# Patient Record
Sex: Female | Born: 1970 | Race: White | Hispanic: No | Smoking: Never smoker
Health system: Southern US, Community
[De-identification: ages and names within clinical notes are randomized; demographics above are authoritative.]

## PROBLEM LIST (undated history)

## (undated) DIAGNOSIS — F329 Major depressive disorder, single episode, unspecified: Secondary | ICD-10-CM

## (undated) DIAGNOSIS — F419 Anxiety disorder, unspecified: Secondary | ICD-10-CM

## (undated) DIAGNOSIS — J45909 Unspecified asthma, uncomplicated: Secondary | ICD-10-CM

## (undated) DIAGNOSIS — F32A Depression, unspecified: Secondary | ICD-10-CM

## (undated) DIAGNOSIS — I38 Endocarditis, valve unspecified: Secondary | ICD-10-CM

## (undated) DIAGNOSIS — M1990 Unspecified osteoarthritis, unspecified site: Secondary | ICD-10-CM

## (undated) HISTORY — PX: TUBAL LIGATION: SHX77

---

## 2015-02-22 ENCOUNTER — Other Ambulatory Visit: Payer: Self-pay | Admitting: Unknown Physician Specialty

## 2015-02-22 DIAGNOSIS — G8929 Other chronic pain: Secondary | ICD-10-CM

## 2015-02-22 DIAGNOSIS — M25562 Pain in left knee: Principal | ICD-10-CM

## 2015-03-15 ENCOUNTER — Ambulatory Visit
Admission: RE | Admit: 2015-03-15 | Discharge: 2015-03-15 | Disposition: A | Discharge status: Home / Self Care | Payer: 59 | Source: Ambulatory Visit | Attending: Unknown Physician Specialty | Admitting: Unknown Physician Specialty

## 2015-03-15 DIAGNOSIS — M949 Disorder of cartilage, unspecified: Secondary | ICD-10-CM | POA: Diagnosis not present

## 2015-03-15 DIAGNOSIS — G8929 Other chronic pain: Secondary | ICD-10-CM | POA: Diagnosis not present

## 2015-03-15 DIAGNOSIS — M25562 Pain in left knee: Secondary | ICD-10-CM | POA: Insufficient documentation

## 2015-03-15 DIAGNOSIS — M2392 Unspecified internal derangement of left knee: Secondary | ICD-10-CM | POA: Diagnosis not present

## 2015-06-24 ENCOUNTER — Encounter: Payer: Self-pay | Admitting: *Deleted

## 2015-06-29 ENCOUNTER — Encounter
Admission: RE | Disposition: A | Discharge status: Home / Self Care | Payer: Self-pay | Source: Ambulatory Visit | Attending: Surgery

## 2015-06-29 ENCOUNTER — Ambulatory Visit
Admission: RE | Admit: 2015-06-29 | Discharge: 2015-06-29 | Disposition: A | Discharge status: Home / Self Care | Payer: Worker's Compensation | Source: Ambulatory Visit | Attending: Surgery | Admitting: Surgery

## 2015-06-29 ENCOUNTER — Ambulatory Visit: Payer: Worker's Compensation | Admitting: Student in an Organized Health Care Education/Training Program

## 2015-06-29 DIAGNOSIS — M222X2 Patellofemoral disorders, left knee: Secondary | ICD-10-CM | POA: Diagnosis present

## 2015-06-29 DIAGNOSIS — F419 Anxiety disorder, unspecified: Secondary | ICD-10-CM | POA: Diagnosis not present

## 2015-06-29 DIAGNOSIS — J45909 Unspecified asthma, uncomplicated: Secondary | ICD-10-CM | POA: Diagnosis not present

## 2015-06-29 DIAGNOSIS — Z885 Allergy status to narcotic agent status: Secondary | ICD-10-CM | POA: Diagnosis not present

## 2015-06-29 DIAGNOSIS — Z880 Allergy status to penicillin: Secondary | ICD-10-CM | POA: Insufficient documentation

## 2015-06-29 DIAGNOSIS — Z882 Allergy status to sulfonamides status: Secondary | ICD-10-CM | POA: Insufficient documentation

## 2015-06-29 DIAGNOSIS — Z79899 Other long term (current) drug therapy: Secondary | ICD-10-CM | POA: Diagnosis not present

## 2015-06-29 DIAGNOSIS — F329 Major depressive disorder, single episode, unspecified: Secondary | ICD-10-CM | POA: Insufficient documentation

## 2015-06-29 DIAGNOSIS — Z791 Long term (current) use of non-steroidal anti-inflammatories (NSAID): Secondary | ICD-10-CM | POA: Insufficient documentation

## 2015-06-29 DIAGNOSIS — M6752 Plica syndrome, left knee: Secondary | ICD-10-CM | POA: Diagnosis not present

## 2015-06-29 DIAGNOSIS — M2242 Chondromalacia patellae, left knee: Secondary | ICD-10-CM | POA: Insufficient documentation

## 2015-06-29 HISTORY — DX: Unspecified asthma, uncomplicated: J45.909

## 2015-06-29 HISTORY — DX: Major depressive disorder, single episode, unspecified: F32.9

## 2015-06-29 HISTORY — DX: Depression, unspecified: F32.A

## 2015-06-29 HISTORY — DX: Unspecified osteoarthritis, unspecified site: M19.90

## 2015-06-29 HISTORY — PX: KNEE ARTHROSCOPY: SHX127

## 2015-06-29 HISTORY — DX: Endocarditis, valve unspecified: I38

## 2015-06-29 HISTORY — DX: Anxiety disorder, unspecified: F41.9

## 2015-06-29 SURGERY — ARTHROSCOPY, KNEE
Anesthesia: General | Laterality: Left | Wound class: Clean

## 2015-06-29 MED ORDER — HYDROCODONE-ACETAMINOPHEN 5-325 MG PO TABS: 1.0000 | ORAL_TABLET | ORAL | Status: DC | PRN

## 2015-06-29 MED ORDER — MEPERIDINE HCL 25 MG/ML IJ SOLN: 6.2500 mg | INTRAMUSCULAR | Status: DC | PRN

## 2015-06-29 MED ORDER — PROMETHAZINE HCL 25 MG/ML IJ SOLN: 6.2500 mg | INTRAMUSCULAR | Status: DC | PRN

## 2015-06-29 MED ORDER — MIDAZOLAM HCL 5 MG/5ML IJ SOLN
INTRAMUSCULAR | Status: DC | PRN
  Administered 2015-06-29: 2 mg via INTRAVENOUS

## 2015-06-29 MED ORDER — LACTATED RINGERS IV SOLN
INTRAVENOUS | Status: DC
  Administered 2015-06-29: 13:00:00 via INTRAVENOUS

## 2015-06-29 MED ORDER — HYDROMORPHONE HCL 1 MG/ML IJ SOLN: 0.2500 mg | INTRAMUSCULAR | Status: DC | PRN

## 2015-06-29 MED ORDER — HYDROCODONE-ACETAMINOPHEN 5-325 MG PO TABS: 1.0000 | ORAL_TABLET | Freq: Four times a day (QID) | ORAL | Status: DC | PRN

## 2015-06-29 MED ORDER — OXYCODONE HCL 5 MG PO TABS
5.0000 mg | ORAL_TABLET | Freq: Once | ORAL | Status: AC | PRN
  Administered 2015-06-29: 5 mg via ORAL

## 2015-06-29 MED ORDER — GLYCOPYRROLATE 0.2 MG/ML IJ SOLN
INTRAMUSCULAR | Status: DC | PRN
  Administered 2015-06-29: 0.1 mg via INTRAVENOUS

## 2015-06-29 MED ORDER — DEXAMETHASONE SODIUM PHOSPHATE 4 MG/ML IJ SOLN
INTRAMUSCULAR | Status: DC | PRN
  Administered 2015-06-29: 4 mg via INTRAVENOUS

## 2015-06-29 MED ORDER — LIDOCAINE HCL (CARDIAC) 20 MG/ML IV SOLN
INTRAVENOUS | Status: DC | PRN
  Administered 2015-06-29: 40 mg via INTRATRACHEAL

## 2015-06-29 MED ORDER — METOCLOPRAMIDE HCL 5 MG PO TABS: 5.0000 mg | ORAL_TABLET | Freq: Three times a day (TID) | ORAL | Status: DC | PRN

## 2015-06-29 MED ORDER — OXYCODONE HCL 5 MG/5ML PO SOLN: 5.0000 mg | Freq: Once | ORAL | Status: AC | PRN

## 2015-06-29 MED ORDER — ONDANSETRON HCL 4 MG/2ML IJ SOLN
INTRAMUSCULAR | Status: DC | PRN
  Administered 2015-06-29: 4 mg via INTRAVENOUS

## 2015-06-29 MED ORDER — CLINDAMYCIN PHOSPHATE 900 MG/50ML IV SOLN
900.0000 mg | Freq: Once | INTRAVENOUS | Status: AC
  Administered 2015-06-29: 900 mg via INTRAVENOUS

## 2015-06-29 MED ORDER — ONDANSETRON HCL 4 MG PO TABS: 4.0000 mg | ORAL_TABLET | Freq: Four times a day (QID) | ORAL | Status: DC | PRN

## 2015-06-29 MED ORDER — BUPIVACAINE-EPINEPHRINE (PF) 0.5% -1:200000 IJ SOLN
INTRAMUSCULAR | Status: DC | PRN
  Administered 2015-06-29: 30 mL

## 2015-06-29 MED ORDER — PROPOFOL 10 MG/ML IV BOLUS
INTRAVENOUS | Status: DC | PRN
  Administered 2015-06-29: 150 mg via INTRAVENOUS

## 2015-06-29 MED ORDER — METOCLOPRAMIDE HCL 5 MG/ML IJ SOLN: 5.0000 mg | Freq: Three times a day (TID) | INTRAMUSCULAR | Status: DC | PRN

## 2015-06-29 MED ORDER — ONDANSETRON HCL 4 MG/2ML IJ SOLN: 4.0000 mg | Freq: Four times a day (QID) | INTRAMUSCULAR | Status: DC | PRN

## 2015-06-29 MED ORDER — FENTANYL CITRATE (PF) 100 MCG/2ML IJ SOLN
INTRAMUSCULAR | Status: DC | PRN
  Administered 2015-06-29: 100 ug via INTRAVENOUS

## 2015-06-29 MED ORDER — POTASSIUM CHLORIDE IN NACL 20-0.9 MEQ/L-% IV SOLN: INTRAVENOUS | Status: DC

## 2015-06-29 MED ORDER — LIDOCAINE HCL 1 % IJ SOLN
INTRAMUSCULAR | Status: DC | PRN
  Administered 2015-06-29: 60 mL via INTRAMUSCULAR

## 2015-06-29 SURGICAL SUPPLY — 30 items
BANDAGE ELASTIC 6 VELCRO NS (GAUZE/BANDAGES/DRESSINGS) ×3 IMPLANT
BLADE FULL RADIUS 3.5 (BLADE) ×3 IMPLANT
BUR ACROMIONIZER 4.0 (BURR) IMPLANT
CHLORAPREP W/TINT 26ML (MISCELLANEOUS) ×3 IMPLANT
COVER LIGHT HANDLE UNIVERSAL (MISCELLANEOUS) ×6 IMPLANT
CUFF TOURN SGL QUICK 30 (MISCELLANEOUS) ×2
CUFF TOURN SGL QUICK 34 (TOURNIQUET CUFF)
CUFF TRNQT CYL 34X4X40X1 (TOURNIQUET CUFF) IMPLANT
CUFF TRNQT CYL LO 30X4X (MISCELLANEOUS) ×1 IMPLANT
DRAPE IMP U-DRAPE 54X76 (DRAPES) ×3 IMPLANT
GAUZE SPONGE 4X4 12PLY STRL (GAUZE/BANDAGES/DRESSINGS) ×3 IMPLANT
GLOVE BIO SURGEON STRL SZ8 (GLOVE) ×6 IMPLANT
GLOVE INDICATOR 8.0 STRL GRN (GLOVE) ×3 IMPLANT
GOWN STRL REUS W/ TWL LRG LVL3 (GOWN DISPOSABLE) ×1 IMPLANT
GOWN STRL REUS W/ TWL XL LVL3 (GOWN DISPOSABLE) ×1 IMPLANT
GOWN STRL REUS W/TWL LRG LVL3 (GOWN DISPOSABLE) ×2
GOWN STRL REUS W/TWL XL LVL3 (GOWN DISPOSABLE) ×2
IV LACTATED RINGER IRRG 3000ML (IV SOLUTION) ×2
IV LR IRRIG 3000ML ARTHROMATIC (IV SOLUTION) ×1 IMPLANT
KIT ROOM TURNOVER OR (KITS) ×3 IMPLANT
MANIFOLD 4PT FOR NEPTUNE1 (MISCELLANEOUS) ×3 IMPLANT
NEEDLE HYPO 21X1.5 SAFETY (NEEDLE) ×6 IMPLANT
PACK ARTHROSCOPY KNEE (MISCELLANEOUS) ×3 IMPLANT
STRAP BODY AND KNEE 60X3 (MISCELLANEOUS) ×3 IMPLANT
SUT PROLENE 4 0 PS 2 18 (SUTURE) ×3 IMPLANT
SUT VIC AB 2-0 CT1 27 (SUTURE)
SUT VIC AB 2-0 CT1 TAPERPNT 27 (SUTURE) IMPLANT
SYR 50ML LL SCALE MARK (SYRINGE) ×3 IMPLANT
TUBING ARTHRO INFLOW-ONLY STRL (TUBING) ×3 IMPLANT
WAND HAND CNTRL MULTIVAC 90 (MISCELLANEOUS) ×3 IMPLANT

## 2015-06-29 NOTE — Anesthesia Preprocedure Evaluation (Addendum)
Anesthesia Evaluation  Patient identified by MRN, date of birth, ID band Patient awake    Reviewed: Allergy & Precautions, NPO status , Patient's Chart, lab work & pertinent test results, reviewed documented beta blocker date and time   Airway Mallampati: II  TM Distance: >3 FB Neck ROM: Full    Dental no notable dental hx.    Pulmonary asthma ,    Pulmonary exam normal        Cardiovascular Normal cardiovascular exam+ Valvular Problems/Murmurs      Neuro/Psych negative neurological ROS     GI/Hepatic negative GI ROS, Neg liver ROS,   Endo/Other  negative endocrine ROS  Renal/GU negative Renal ROS  negative genitourinary   Musculoskeletal  (+) Arthritis , Osteoarthritis,    Abdominal   Peds  Hematology   Anesthesia Other Findings   Reproductive/Obstetrics                             Anesthesia Physical Anesthesia Plan  ASA: II  Anesthesia Plan: General   Post-op Pain Management:    Induction: Intravenous  Airway Management Planned: LMA  Additional Equipment:   Intra-op Plan:   Post-operative Plan:   Informed Consent: I have reviewed the patients History and Physical, chart, labs and discussed the procedure including the risks, benefits and alternatives for the proposed anesthesia with the patient or authorized representative who has indicated his/her understanding and acceptance.     Plan Discussed with: CRNA  Anesthesia Plan Comments:         Anesthesia Quick Evaluation

## 2015-06-29 NOTE — Transfer of Care (Signed)
Immediate Anesthesia Transfer of Care Note  Patient: Sara Velasquez  Procedure(s) Performed: Procedure(s): ARTHROSCOPY KNEE WITH DEBRIDEMENT ABRASION CHONDNOPLASTY OF THE PATELLA AND POSSIBLE LATERAL RELEASE (Left)  Patient Location: PACU  Anesthesia Type: General  Level of Consciousness: awake, alert  and patient cooperative  Airway and Oxygen Therapy: Patient Spontanous Breathing and Patient connected to supplemental oxygen  Post-op Assessment: Post-op Vital signs reviewed, Patient's Cardiovascular Status Stable, Respiratory Function Stable, Patent Airway and No signs of Nausea or vomiting  Post-op Vital Signs: Reviewed and stable  Complications: No apparent anesthesia complications

## 2015-06-29 NOTE — Anesthesia Procedure Notes (Signed)
Procedure Name: LMA Insertion Date/Time: 06/29/2015 3:25 PM Performed by: Jimmy PicketAMYOT, Diar Berkel Pre-anesthesia Checklist: Patient identified, Emergency Drugs available, Suction available, Timeout performed and Patient being monitored Patient Re-evaluated:Patient Re-evaluated prior to inductionOxygen Delivery Method: Circle system utilized Preoxygenation: Pre-oxygenation with 100% oxygen Intubation Type: IV induction LMA: LMA inserted LMA Size: 4.0 Number of attempts: 1 Placement Confirmation: positive ETCO2 and breath sounds checked- equal and bilateral Tube secured with: Tape

## 2015-06-29 NOTE — H&P (Signed)
Paper H&P to be scanned into permanent record. H&P reviewed. No changes. 

## 2015-06-29 NOTE — Op Note (Signed)
06/29/2015  4:18 PM  Patient:   Sara Velasquez  Pre-Op Diagnosis:   Patellofemoral syndrome, left knee.  Postoperative diagnosis:   Patellofemoral syndrome with medial plica, left knee.  Procedure:   Arthroscopic excision of medial plica with abrasion chondroplasty of patella, left knee.  Surgeon:   Maryagnes AmosJ. Jeffrey Seleena Reimers, M.D.  Anesthesia:   General LMA.  Findings:   As above. The medial and lateral menisci both were in excellent condition, as were the anterior and posterior cruciate ligaments. 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 The remaining articular surfaces all were in excellent condition. The patella appeared to track well and showed no excessive tightness laterally.  Complications:   None.  EBL:   10 cc.  Total fluids:   700 cc of crystalloid.  Tourniquet time:   None  Drains:   None  Closure:   4-0 Prolene interrupted sutures.  Brief clinical note:   The patient is a 45 year old female with a history of anterior knee pain. Her symptoms have persisted despite medications, activity modification, etc. Her history and examination are consistent with patellofemoral syndrome with a possible symptomatic plica. An MRI scan demonstrated an area of chondromalacia on the central ridge of the patella, but no evidence for meniscal or ligamentous pathology. The patient presents at this time for arthroscopy, debridement, and possible lateral release.  Procedure:   The patient was brought into the operating room and lain in the supine position. After adequate general laryngal mask anesthesia was obtained, a timeout was performed to verify the appropriate side. The patient's left knee was injected sterilely using a solution of 30 cc of 1% lidocaine and 30 cc of 0.5% Sensorcaine with epinephrine.  The left lower extremity was prepped with ChloraPrep solution before being draped sterilely. Preoperative antibiotics were administered. The expected portal sites were injected with 0.5% Sensorcaine with epinephrine before the camera was placed in the anterolateral portal and instrumentation performed through the anteromedial portal. The knee was sequentially examined beginning in the suprapatellar pouch, then progressing to the patellofemoral space, the medial gutter compartment, the notch, and finally the lateral compartment and gutter. The findings were as described above. Abundant reactive synovial tissues anteriorly were debrided using the full-radius resector in order to improve visualization. While performing this, a medial shelf plica was identified and debrided as well. The medial and lateral menisci were carefully probed with the findings as described above. The area of chondromalacia was debrided back to stable margins using the full-radius resector. The ArthroCare wand was used to obtain hemostasis before the instruments were removed from the joint after suctioning the excess fluid. It was elected not to perform a lateral release as the patella appeared to be tracking well and there was no evidence of any excessive tightness laterally. The portal sites were closed using 4-0 Prolene interrupted sutures before a sterile bulky dressing was applied to the knee. The patient was then awakened, extubated, and returned to the recovery room in satisfactory condition after tolerating the procedure well.

## 2015-06-29 NOTE — Discharge Instructions (Signed)
General Anesthesia, Adult, Care After °Refer to this sheet in the next few weeks. These instructions provide you with information on caring for yourself after your procedure. Your health care provider may also give you more specific instructions. Your treatment has been planned according to current medical practices, but problems sometimes occur. Call your health care provider if you have any problems or questions after your procedure. °WHAT TO EXPECT AFTER THE PROCEDURE °After the procedure, it is typical to experience: °· Sleepiness. °· Nausea and vomiting. °HOME CARE INSTRUCTIONS °· For the first 24 hours after general anesthesia: °¨ Have a responsible person with you. °¨ Do not drive a car. If you are alone, do not take public transportation. °¨ Do not drink alcohol. °¨ Do not take medicine that has not been prescribed by your health care provider. °¨ Do not sign important papers or make important decisions. °¨ You may resume a normal diet and activities as directed by your health care provider. °· Change bandages (dressings) as directed. °· If you have questions or problems that seem related to general anesthesia, call the hospital and ask for the anesthetist or anesthesiologist on call. °SEEK MEDICAL CARE IF: °· You have nausea and vomiting that continue the day after anesthesia. °· You develop a rash. °SEEK IMMEDIATE MEDICAL CARE IF:  °· You have difficulty breathing. °· You have chest pain. °· You have any allergic problems. °  °This information is not intended to replace advice given to you by your health care provider. Make sure you discuss any questions you have with your health care provider. °  °Document Released: 06/25/2000 Document Revised: 04/09/2014 Document Reviewed: 07/18/2011 °Elsevier Interactive Patient Education ©2016 [employer] ° °Keep dressing dry and intact.  °May shower after dressing changed on post-op day #4 (Sunday).  °Cover staples/sutures with Band-Aids after drying off. °Apply ice  frequently to knee. °May weight-bear as tolerated - use crutches or walker as needed. °Follow-up in 10-14 days or as scheduled. °

## 2015-06-30 ENCOUNTER — Encounter: Payer: Self-pay | Admitting: Surgery

## 2015-06-30 NOTE — Anesthesia Postprocedure Evaluation (Signed)
Anesthesia Post Note  Patient: Sara Velasquez  Procedure(s) Performed: Procedure(s) (LRB): Arthroscopic excision of medial plica with abrasion chondroplasty of patella, left knee. (Left)  Patient location during evaluation: PACU Anesthesia Type: General Level of consciousness: awake and alert and oriented Pain management: pain level controlled Vital Signs Assessment: post-procedure vital signs reviewed and stable Respiratory status: spontaneous breathing and nonlabored ventilation Cardiovascular status: stable Postop Assessment: no signs of nausea or vomiting and adequate PO intake Anesthetic complications: no    Sara Velasquez

## 2017-01-28 ENCOUNTER — Other Ambulatory Visit: Payer: Self-pay | Admitting: Family Medicine

## 2017-01-28 DIAGNOSIS — Z1231 Encounter for screening mammogram for malignant neoplasm of breast: Secondary | ICD-10-CM

## 2017-01-28 DIAGNOSIS — Z01419 Encounter for gynecological examination (general) (routine) without abnormal findings: Secondary | ICD-10-CM

## 2017-02-20 ENCOUNTER — Ambulatory Visit
Admission: RE | Admit: 2017-02-20 | Discharge: 2017-02-20 | Disposition: A | Discharge status: Home / Self Care | Payer: 59 | Source: Ambulatory Visit | Attending: Family Medicine | Admitting: Family Medicine

## 2017-02-20 DIAGNOSIS — Z1231 Encounter for screening mammogram for malignant neoplasm of breast: Secondary | ICD-10-CM | POA: Insufficient documentation

## 2017-02-20 DIAGNOSIS — Z01419 Encounter for gynecological examination (general) (routine) without abnormal findings: Secondary | ICD-10-CM

## 2017-12-23 ENCOUNTER — Other Ambulatory Visit: Payer: Self-pay | Admitting: Obstetrics and Gynecology

## 2017-12-23 DIAGNOSIS — N63 Unspecified lump in unspecified breast: Secondary | ICD-10-CM

## 2017-12-24 ENCOUNTER — Other Ambulatory Visit: Payer: Self-pay | Admitting: Podiatry

## 2018-01-01 ENCOUNTER — Ambulatory Visit
Admission: RE | Admit: 2018-01-01 | Discharge: 2018-01-01 | Disposition: A | Discharge status: Home / Self Care | Payer: 59 | Source: Ambulatory Visit | Attending: Obstetrics and Gynecology | Admitting: Obstetrics and Gynecology

## 2018-01-01 DIAGNOSIS — N63 Unspecified lump in unspecified breast: Secondary | ICD-10-CM | POA: Diagnosis not present

## 2018-01-22 ENCOUNTER — Encounter: Payer: Self-pay | Admitting: *Deleted

## 2018-01-22 ENCOUNTER — Other Ambulatory Visit: Payer: Self-pay

## 2018-01-28 NOTE — Anesthesia Preprocedure Evaluation (Addendum)
Anesthesia Evaluation  Patient identified by MRN, date of birth, ID band Patient awake    Reviewed: Allergy & Precautions, NPO status , Patient's Chart, lab work & pertinent test results  History of Anesthesia Complications Negative for: history of anesthetic complications  Airway Mallampati: I  TM Distance: >3 FB Neck ROM: Full    Dental no notable dental hx.    Pulmonary asthma ,    Pulmonary exam normal breath sounds clear to auscultation       Cardiovascular Exercise Tolerance: Good  Rhythm:Regular Rate:Normal + Systolic murmurs Murmur   Echo 04/10/16:  NORMAL LEFT VENTRICULAR SYSTOLIC FUNCTION NORMAL RIGHT VENTRICULAR SYSTOLIC FUNCTION MILD VALVULAR REGURGITATION  NO VALVULAR STENOSIS EF >55% MILD MR, MILD TR, MILD LVH   Neuro/Psych PSYCHIATRIC DISORDERS Anxiety Depression Bipolar Disorder negative neurological ROS     GI/Hepatic negative GI ROS,   Endo/Other  negative endocrine ROS  Renal/GU negative Renal ROS     Musculoskeletal  (+) Arthritis ,   Abdominal   Peds  Hematology negative hematology ROS (+)   Anesthesia Other Findings   Reproductive/Obstetrics                            Anesthesia Physical Anesthesia Plan  ASA: II  Anesthesia Plan: General   Post-op Pain Management:    Induction: Intravenous  PONV Risk Score and Plan: 3 and Dexamethasone and Ondansetron  Airway Management Planned: LMA  Additional Equipment:   Intra-op Plan:   Post-operative Plan: Extubation in OR  Informed Consent: I have reviewed the patients History and Physical, chart, labs and discussed the procedure including the risks, benefits and alternatives for the proposed anesthesia with the patient or authorized representative who has indicated his/her understanding and acceptance.     Plan Discussed with: CRNA  Anesthesia Plan Comments:         Anesthesia Quick  Evaluation

## 2018-01-29 ENCOUNTER — Ambulatory Visit: Payer: 59 | Admitting: Anesthesiology

## 2018-01-29 ENCOUNTER — Encounter
Admission: RE | Disposition: A | Discharge status: Home / Self Care | Payer: Self-pay | Source: Ambulatory Visit | Attending: Podiatry

## 2018-01-29 ENCOUNTER — Ambulatory Visit
Admission: RE | Admit: 2018-01-29 | Discharge: 2018-01-29 | Disposition: A | Discharge status: Home / Self Care | Payer: 59 | Source: Ambulatory Visit | Attending: Podiatry | Admitting: Podiatry

## 2018-01-29 DIAGNOSIS — M1990 Unspecified osteoarthritis, unspecified site: Secondary | ICD-10-CM | POA: Diagnosis not present

## 2018-01-29 DIAGNOSIS — F319 Bipolar disorder, unspecified: Secondary | ICD-10-CM | POA: Insufficient documentation

## 2018-01-29 DIAGNOSIS — J45990 Exercise induced bronchospasm: Secondary | ICD-10-CM | POA: Insufficient documentation

## 2018-01-29 DIAGNOSIS — I081 Rheumatic disorders of both mitral and tricuspid valves: Secondary | ICD-10-CM | POA: Diagnosis not present

## 2018-01-29 DIAGNOSIS — M2011 Hallux valgus (acquired), right foot: Secondary | ICD-10-CM | POA: Insufficient documentation

## 2018-01-29 DIAGNOSIS — Z79899 Other long term (current) drug therapy: Secondary | ICD-10-CM | POA: Diagnosis not present

## 2018-01-29 DIAGNOSIS — F419 Anxiety disorder, unspecified: Secondary | ICD-10-CM | POA: Insufficient documentation

## 2018-01-29 DIAGNOSIS — E782 Mixed hyperlipidemia: Secondary | ICD-10-CM | POA: Diagnosis not present

## 2018-01-29 DIAGNOSIS — R011 Cardiac murmur, unspecified: Secondary | ICD-10-CM | POA: Insufficient documentation

## 2018-01-29 HISTORY — PX: HALLUX VALGUS AUSTIN: SHX6623

## 2018-01-29 SURGERY — CORRECTION, HALLUX VALGUS
Anesthesia: General | Site: Foot | Laterality: Right

## 2018-01-29 MED ORDER — CLINDAMYCIN PHOSPHATE 900 MG/50ML IV SOLN: 900.0000 mg | INTRAVENOUS | Status: DC

## 2018-01-29 MED ORDER — ONDANSETRON HCL 4 MG/2ML IJ SOLN: 4.0000 mg | Freq: Once | INTRAMUSCULAR | Status: DC | PRN

## 2018-01-29 MED ORDER — ACETAMINOPHEN 160 MG/5ML PO SOLN: 325.0000 mg | ORAL | Status: DC | PRN

## 2018-01-29 MED ORDER — CLINDAMYCIN PHOSPHATE 600 MG/50ML IV SOLN
600.0000 mg | INTRAVENOUS | Status: AC
  Administered 2018-01-29: 600 mg via INTRAVENOUS

## 2018-01-29 MED ORDER — LIDOCAINE HCL (CARDIAC) PF 100 MG/5ML IV SOSY
PREFILLED_SYRINGE | INTRAVENOUS | Status: DC | PRN
  Administered 2018-01-29: 40 mg via INTRATRACHEAL

## 2018-01-29 MED ORDER — FENTANYL CITRATE (PF) 100 MCG/2ML IJ SOLN
INTRAMUSCULAR | Status: DC | PRN
  Administered 2018-01-29 (×2): 25 ug via INTRAVENOUS

## 2018-01-29 MED ORDER — MIDAZOLAM HCL 5 MG/5ML IJ SOLN
INTRAMUSCULAR | Status: DC | PRN
  Administered 2018-01-29: 2 mg via INTRAVENOUS

## 2018-01-29 MED ORDER — LACTATED RINGERS IV SOLN
INTRAVENOUS | Status: DC
  Administered 2018-01-29: 14:00:00 via INTRAVENOUS

## 2018-01-29 MED ORDER — BUPIVACAINE-EPINEPHRINE 0.25% -1:200000 IJ SOLN
INTRAMUSCULAR | Status: DC | PRN
  Administered 2018-01-29: 10 mL

## 2018-01-29 MED ORDER — EPHEDRINE SULFATE 50 MG/ML IJ SOLN
INTRAMUSCULAR | Status: DC | PRN
  Administered 2018-01-29 (×4): 10 mg via INTRAVENOUS

## 2018-01-29 MED ORDER — FENTANYL CITRATE (PF) 100 MCG/2ML IJ SOLN: 25.0000 ug | INTRAMUSCULAR | Status: DC | PRN

## 2018-01-29 MED ORDER — POVIDONE-IODINE 7.5 % EX SOLN: Freq: Once | CUTANEOUS | Status: DC

## 2018-01-29 MED ORDER — PROPOFOL 10 MG/ML IV BOLUS
INTRAVENOUS | Status: DC | PRN
  Administered 2018-01-29: 50 mg via INTRAVENOUS
  Administered 2018-01-29: 200 mg via INTRAVENOUS

## 2018-01-29 MED ORDER — DEXAMETHASONE SODIUM PHOSPHATE 4 MG/ML IJ SOLN
INTRAMUSCULAR | Status: DC | PRN
  Administered 2018-01-29: 4 mg via INTRAVENOUS

## 2018-01-29 MED ORDER — OXYCODONE-ACETAMINOPHEN 5-325 MG PO TABS: 1.0000 | ORAL_TABLET | Freq: Three times a day (TID) | ORAL | 0 refills | Status: DC | PRN

## 2018-01-29 MED ORDER — BUPIVACAINE LIPOSOME 1.3 % IJ SUSP
INTRAMUSCULAR | Status: DC | PRN
  Administered 2018-01-29: 10 mL

## 2018-01-29 MED ORDER — LACTATED RINGERS IV SOLN: 500.0000 mL | INTRAVENOUS | Status: DC

## 2018-01-29 MED ORDER — ONDANSETRON HCL 4 MG/2ML IJ SOLN
INTRAMUSCULAR | Status: DC | PRN
  Administered 2018-01-29: 4 mg via INTRAVENOUS

## 2018-01-29 MED ORDER — HYDROCODONE-ACETAMINOPHEN 5-325 MG PO TABS: 1.0000 | ORAL_TABLET | Freq: Four times a day (QID) | ORAL | 0 refills | Status: AC | PRN

## 2018-01-29 MED ORDER — DEXMEDETOMIDINE HCL 200 MCG/2ML IV SOLN
INTRAVENOUS | Status: DC | PRN
  Administered 2018-01-29: 6 ug via INTRAVENOUS

## 2018-01-29 MED ORDER — ACETAMINOPHEN 325 MG PO TABS: 325.0000 mg | ORAL_TABLET | ORAL | Status: DC | PRN

## 2018-01-29 SURGICAL SUPPLY — 38 items
BANDAGE ELASTIC 4 VELCRO NS (GAUZE/BANDAGES/DRESSINGS) ×2 IMPLANT
BENZOIN TINCTURE PRP APPL 2/3 (GAUZE/BANDAGES/DRESSINGS) ×2 IMPLANT
BLADE MED AGGRESSIVE (BLADE) ×2 IMPLANT
BLADE OSC/SAGITTAL MD 5.5X18 (BLADE) IMPLANT
BNDG COHESIVE 4X5 TAN STRL (GAUZE/BANDAGES/DRESSINGS) ×2 IMPLANT
BNDG ESMARK 4X12 TAN STRL LF (GAUZE/BANDAGES/DRESSINGS) ×2 IMPLANT
BNDG GAUZE 4.5X4.1 6PLY STRL (MISCELLANEOUS) ×2 IMPLANT
BNDG STRETCH 4X75 STRL LF (GAUZE/BANDAGES/DRESSINGS) ×2 IMPLANT
COVER LIGHT HANDLE UNIVERSAL (MISCELLANEOUS) ×4 IMPLANT
CUFF TOURN SGL QUICK 18 (TOURNIQUET CUFF) IMPLANT
DRAPE FLUOR MINI C-ARM 54X84 (DRAPES) ×2 IMPLANT
DURAPREP 26ML APPLICATOR (WOUND CARE) ×2 IMPLANT
ELECT REM PT RETURN 9FT ADLT (ELECTROSURGICAL) ×2
ELECTRODE REM PT RTRN 9FT ADLT (ELECTROSURGICAL) ×1 IMPLANT
GAUZE PETRO XEROFOAM 1X8 (MISCELLANEOUS) ×2 IMPLANT
GAUZE SPONGE 4X4 12PLY STRL (GAUZE/BANDAGES/DRESSINGS) ×2 IMPLANT
GLOVE BIO SURGEON STRL SZ7.5 (GLOVE) ×4 IMPLANT
GLOVE INDICATOR 8.0 STRL GRN (GLOVE) ×4 IMPLANT
GOWN STRL REUS W/ TWL LRG LVL3 (GOWN DISPOSABLE) ×2 IMPLANT
GOWN STRL REUS W/TWL LRG LVL3 (GOWN DISPOSABLE) ×2
K-WIRE DBL END TROCAR 6X.045 (WIRE) ×2
K-WIRE DBL END TROCAR 6X.062 (WIRE)
K-WIRE DBL TROCAR .062X4 ×2 IMPLANT
KIT TURNOVER KIT A (KITS) ×2 IMPLANT
KWIRE DBL END TROCAR 6X.045 (WIRE) ×1 IMPLANT
KWIRE DBL END TROCAR 6X.062 (WIRE) IMPLANT
KWIRE DBL TROCAR .062X4 ×1 IMPLANT
NS IRRIG 500ML POUR BTL (IV SOLUTION) ×2 IMPLANT
PACK EXTREMITY ARMC (MISCELLANEOUS) ×2 IMPLANT
PENCIL SMOKE EVACUATOR (MISCELLANEOUS) ×2 IMPLANT
PIN BALLS 3/8 F/.045 WIRE (MISCELLANEOUS) IMPLANT
RASP SM TEAR CROSS CUT (RASP) ×2 IMPLANT
STOCKINETTE IMPERVIOUS LG (DRAPES) ×2 IMPLANT
STRIP CLOSURE SKIN 1/4X4 (GAUZE/BANDAGES/DRESSINGS) ×2 IMPLANT
SUT ETHILON 5-0 FS-2 18 BLK (SUTURE) IMPLANT
SUT MNCRL 5-0+ PC-1 (SUTURE) ×1 IMPLANT
SUT MONOCRYL 5-0 (SUTURE) ×1
SUT VIC AB 4-0 FS2 27 (SUTURE) ×2 IMPLANT

## 2018-01-29 NOTE — Anesthesia Procedure Notes (Signed)
Procedure Name: LMA Insertion Date/Time: 01/29/2018 2:49 PM Performed by: Maryan Rued, CRNA Pre-anesthesia Checklist: Patient identified, Emergency Drugs available, Suction available, Timeout performed and Patient being monitored Patient Re-evaluated:Patient Re-evaluated prior to induction Oxygen Delivery Method: Circle system utilized Preoxygenation: Pre-oxygenation with 100% oxygen Induction Type: IV induction LMA: LMA inserted LMA Size: 4.0 Number of attempts: 1 Placement Confirmation: positive ETCO2 and breath sounds checked- equal and bilateral Tube secured with: Tape

## 2018-01-29 NOTE — Anesthesia Postprocedure Evaluation (Signed)
Anesthesia Post Note  Patient: Sara Velasquez  Procedure(s) Performed: HALLUX VALGUS AUSTIN (Right Foot)  Patient location during evaluation: PACU Anesthesia Type: General Level of consciousness: awake and alert, oriented and patient cooperative Pain management: pain level controlled Vital Signs Assessment: post-procedure vital signs reviewed and stable Respiratory status: spontaneous breathing, nonlabored ventilation and respiratory function stable Cardiovascular status: blood pressure returned to baseline and stable Postop Assessment: adequate PO intake Anesthetic complications: no    Reed Breech

## 2018-01-29 NOTE — Discharge Instructions (Signed)
General Anesthesia, Adult, Care After °These instructions provide you with information about caring for yourself after your procedure. Your health care provider may also give you more specific instructions. Your treatment has been planned according to current medical practices, but problems sometimes occur. Call your health care provider if you have any problems or questions after your procedure. °What can I expect after the procedure? °After the procedure, it is common to have: °· Vomiting. °· A sore throat. °· Mental slowness. ° °It is common to feel: °· Nauseous. °· Cold or shivery. °· Sleepy. °· Tired. °· Sore or achy, even in parts of your body where you did not have surgery. ° °Follow these instructions at home: °For at least 24 hours after the procedure: °· Do not: °? Participate in activities where you could fall or become injured. °? Drive. °? Use heavy machinery. °? Drink alcohol. °? Take sleeping pills or medicines that cause drowsiness. °? Make important decisions or sign legal documents. °? Take care of children on your own. °· Rest. °Eating and drinking °· If you vomit, drink water, juice, or soup when you can drink without vomiting. °· Drink enough fluid to keep your urine clear or pale yellow. °· Make sure you have little or no nausea before eating solid foods. °· Follow the diet recommended by your health care provider. °General instructions °· Have a responsible adult stay with you until you are awake and alert. °· Return to your normal activities as told by your health care provider. Ask your health care provider what activities are safe for you. °· Take over-the-counter and prescription medicines only as told by your health care provider. °· If you smoke, do not smoke without supervision. °· Keep all follow-up visits as told by your health care provider. This is important. °Contact a health care provider if: °· You continue to have nausea or vomiting at home, and medicines are not helpful. °· You  cannot drink fluids or start eating again. °· You cannot urinate after 8-12 hours. °· You develop a skin rash. °· You have fever. °· You have increasing redness at the site of your procedure. °Get help right away if: °· You have difficulty breathing. °· You have chest pain. °· You have unexpected bleeding. °· You feel that you are having a life-threatening or urgent problem. °This information is not intended to replace advice given to you by your health care provider. Make sure you discuss any questions you have with your health care provider. °Document Released: 06/25/2000 Document Revised: 08/22/2015 Document Reviewed: 03/03/2015 °Elsevier Interactive Patient Education © 2018 Elsevier Inc. ° ° °[facility] °[facility] ° °POST OPERATIVE INSTRUCTIONS FOR DR. TROXLER AND DR. FOWLER °[facility] ° ° °1. Take your medication as prescribed.  Pain medication should be taken only as needed. ° °2. Keep the dressing clean, dry and intact. ° °3. Keep your foot elevated above the heart level for the first 48 hours. ° °4. Walking to the bathroom and brief periods of walking are acceptable, unless we have instructed you to be non-weight bearing. ° °5. Always wear your post-op shoe when walking.  Always use your crutches if you are to be non-weight bearing. ° °6. Do not take a shower. Baths are permissible as long as the foot is kept out of the water.  ° °7. Every hour you are awake:  °- Bend your knee 15 times. °- Flex foot 15 times °- Massage calf 15 times ° °  8. Call [facility] ([phone]) if any of the following problems occur: °- You develop a temperature or fever. °- The bandage becomes saturated with blood. °- Medication does not stop your pain. °- Injury of the foot occurs. °- Any symptoms of infection including redness, odor, or red streaks running from wound. ° °

## 2018-01-29 NOTE — H&P (Signed)
HISTORY AND PHYSICAL INTERVAL NOTE:  01/29/2018  2:36 PM  Sara Velasquez  has presented today for surgery, with the diagnosis of Hallux Valgus W/Bunions of Rt foot.  The various methods of treatment have been discussed with the patient.  No guarantees were given.  After consideration of risks, benefits and other options for treatment, the patient has consented to surgery.  I have reviewed the patients' chart and labs.     A history and physical examination was performed in my office.  The patient was reexamined.  There have been no changes to this history and physical examination.  Gwyneth Revels A

## 2018-01-29 NOTE — Op Note (Signed)
Operative note   Surgeon:Tahni Porchia [employer] forces logistics/support/administrative officer: None    Preop diagnosis: Right foot hallux valgus    Postop diagnosis: Same    Procedure: 1.  [place] hallux valgus surgery right foot 2.  Intraoperative fluoroscopy    EBL: Normal    Anesthesia:local and general area local consisted of a one-to-one mixture of 0.25 bupivacaine and Exparel long-acting anesthetic.  A total of 20 cc was used    Hemostasis: Ankle tourniquet inflated to 200 mmHg for approximately 35 minutes    Specimen: None    Complications: None    Operative indications:Sara Velasquez is an 47 y.o. that presents today for surgical intervention.  The risks/benefits/alternatives/complications have been discussed and consent has been given.    Procedure:  Patient was brought into the OR and placed on the operating table in thesupine position. After anesthesia was obtained theright lower extremity was prepped and draped in usual sterile fashion.  Attention was directed to the dorsal medial right first MTPJ where an incision was performed.  Sharp and blunt dissection carried down to the capsule.  A T capsulotomy was then performed.  The dorsal medial eminence was noted and transected with a power saw.  Next a V osteotomy was created.  The capital fragment was translocated laterally.  This was stabilized with a 0.062 K wire.  Good alignment was noted at the first MTPJ.  Good reduction of the prominence as well as decreased intermetatarsal angle was noted with the use of intraoperative fluoroscopy.  The ensuing overhanging ledge was transected with a power saw and smoothed with a power rasp.  The wound was flushed with copious amounts of irrigation.  The capsule was closed with 4-0 Vicryl.  The skin was closed with a 5-0 Monocryl.  Compressive sterile dressing was applied.    Patient tolerated the procedure and anesthesia well.  Was transported from the OR to the PACU with all vital signs stable and vascular status  intact. To be discharged per routine protocol.  Will follow up in approximately 1 week in the outpatient clinic.

## 2018-01-29 NOTE — Transfer of Care (Signed)
Immediate Anesthesia Transfer of Care Note  Patient: Sara Velasquez  Procedure(s) Performed: HALLUX VALGUS AUSTIN (Right Foot)  Patient Location: PACU  Anesthesia Type: General  Level of Consciousness: awake, alert  and patient cooperative  Airway and Oxygen Therapy: Patient Spontanous Breathing and Patient connected to supplemental oxygen  Post-op Assessment: Post-op Vital signs reviewed, Patient's Cardiovascular Status Stable, Respiratory Function Stable, Patent Airway and No signs of Nausea or vomiting  Post-op Vital Signs: Reviewed and stable  Complications: No apparent anesthesia complications

## 2018-01-30 ENCOUNTER — Encounter: Payer: Self-pay | Admitting: Podiatry

## 2018-12-16 ENCOUNTER — Other Ambulatory Visit: Payer: Self-pay | Admitting: Obstetrics and Gynecology

## 2018-12-16 DIAGNOSIS — Z1231 Encounter for screening mammogram for malignant neoplasm of breast: Secondary | ICD-10-CM

## 2019-01-23 ENCOUNTER — Ambulatory Visit
Admission: RE | Admit: 2019-01-23 | Discharge: 2019-01-23 | Disposition: A | Discharge status: Home / Self Care | Payer: 59 | Source: Ambulatory Visit | Attending: Obstetrics and Gynecology | Admitting: Obstetrics and Gynecology

## 2019-01-23 DIAGNOSIS — Z1231 Encounter for screening mammogram for malignant neoplasm of breast: Secondary | ICD-10-CM | POA: Insufficient documentation

## 2020-03-16 ENCOUNTER — Other Ambulatory Visit: Payer: Self-pay | Admitting: Obstetrics and Gynecology

## 2020-03-16 DIAGNOSIS — Z1231 Encounter for screening mammogram for malignant neoplasm of breast: Secondary | ICD-10-CM

## 2020-03-28 ENCOUNTER — Ambulatory Visit
Admission: RE | Admit: 2020-03-28 | Discharge: 2020-03-28 | Disposition: A | Discharge status: Home / Self Care | Payer: 59 | Source: Ambulatory Visit | Attending: Obstetrics and Gynecology | Admitting: Obstetrics and Gynecology

## 2020-03-28 ENCOUNTER — Other Ambulatory Visit: Payer: Self-pay

## 2020-03-28 DIAGNOSIS — Z1231 Encounter for screening mammogram for malignant neoplasm of breast: Secondary | ICD-10-CM | POA: Diagnosis present

## 2020-06-29 ENCOUNTER — Other Ambulatory Visit: Payer: Self-pay

## 2020-06-29 ENCOUNTER — Ambulatory Visit
Admission: RE | Admit: 2020-06-29 | Discharge: 2020-06-29 | Disposition: A | Discharge status: Home / Self Care | Payer: 59 | Attending: Physician Assistant | Admitting: Physician Assistant

## 2020-06-29 ENCOUNTER — Inpatient Hospital Stay: Admit: 2020-06-29 | Payer: 59

## 2020-06-29 ENCOUNTER — Other Ambulatory Visit: Payer: Self-pay | Admitting: Physician Assistant

## 2020-06-29 ENCOUNTER — Ambulatory Visit
Admission: RE | Admit: 2020-06-29 | Discharge: 2020-06-29 | Disposition: A | Discharge status: Home / Self Care | Payer: 59 | Source: Ambulatory Visit | Attending: Physician Assistant | Admitting: Physician Assistant

## 2020-06-29 DIAGNOSIS — M545 Low back pain, unspecified: Secondary | ICD-10-CM

## 2021-07-09 IMAGING — MG DIGITAL SCREENING BILAT W/ TOMO W/ CAD
6 of 10 series · 6 of 30 positions shown · non-contrast
Comparison: Previous exam(s).

CLINICAL DATA: Screening.

EXAM:
DIGITAL SCREENING BILATERAL MAMMOGRAM WITH TOMO AND CAD

[L CC synth-2D (1 of 2)]
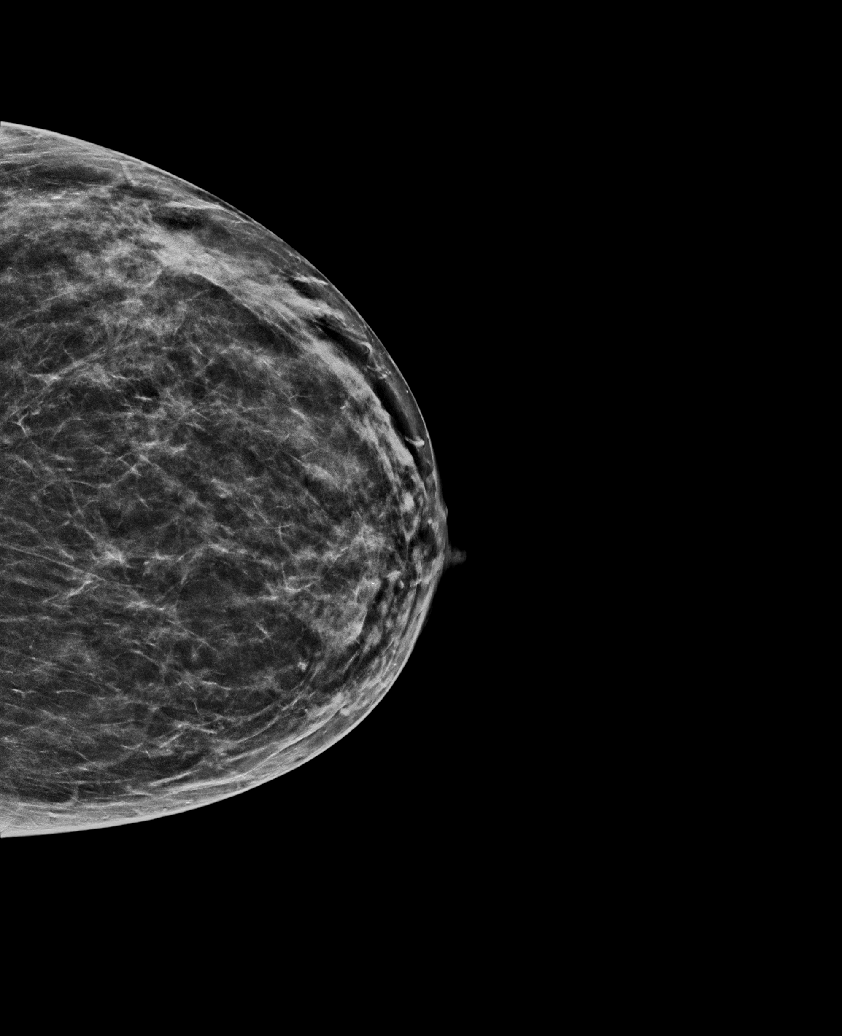

[R MLO synth-2D]
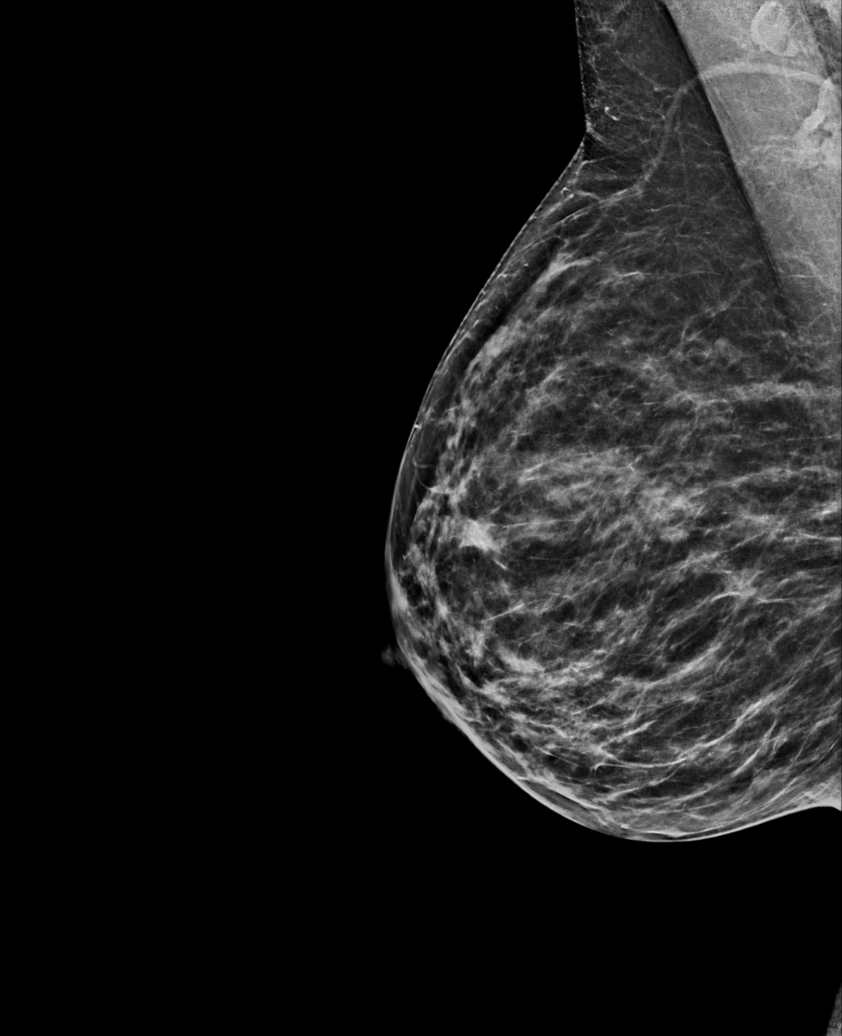

[L MLO synth-2D]
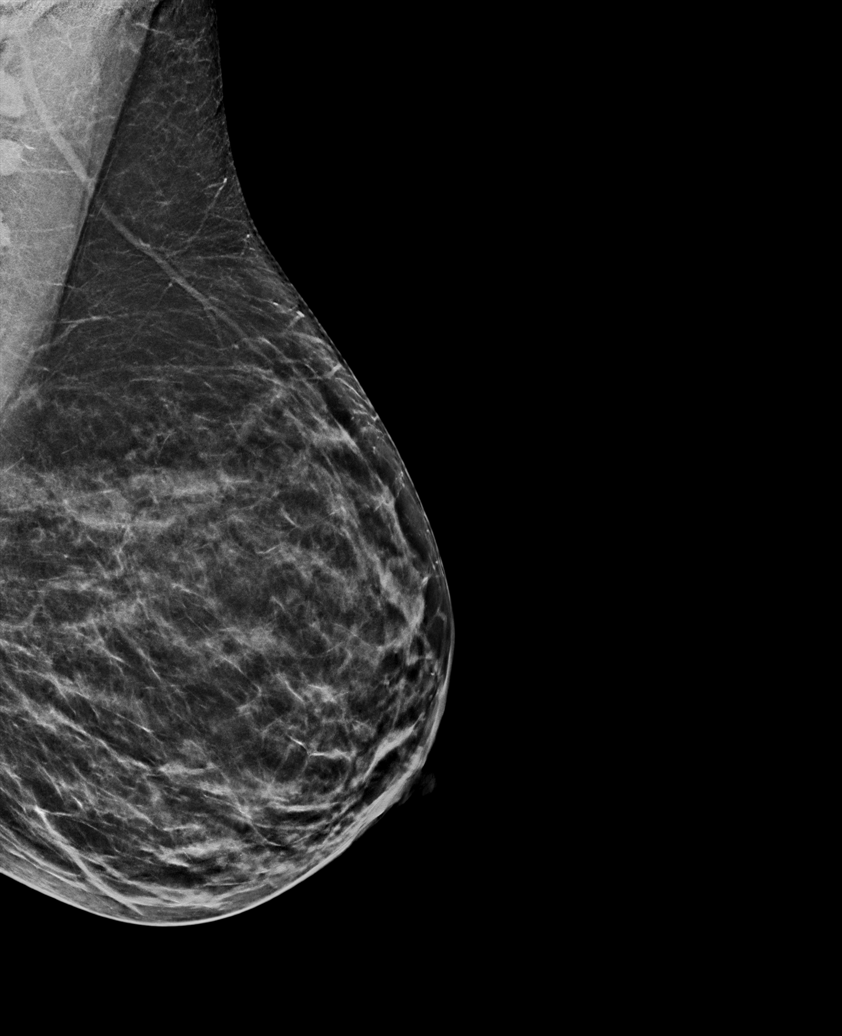

[R CC synth-2D]
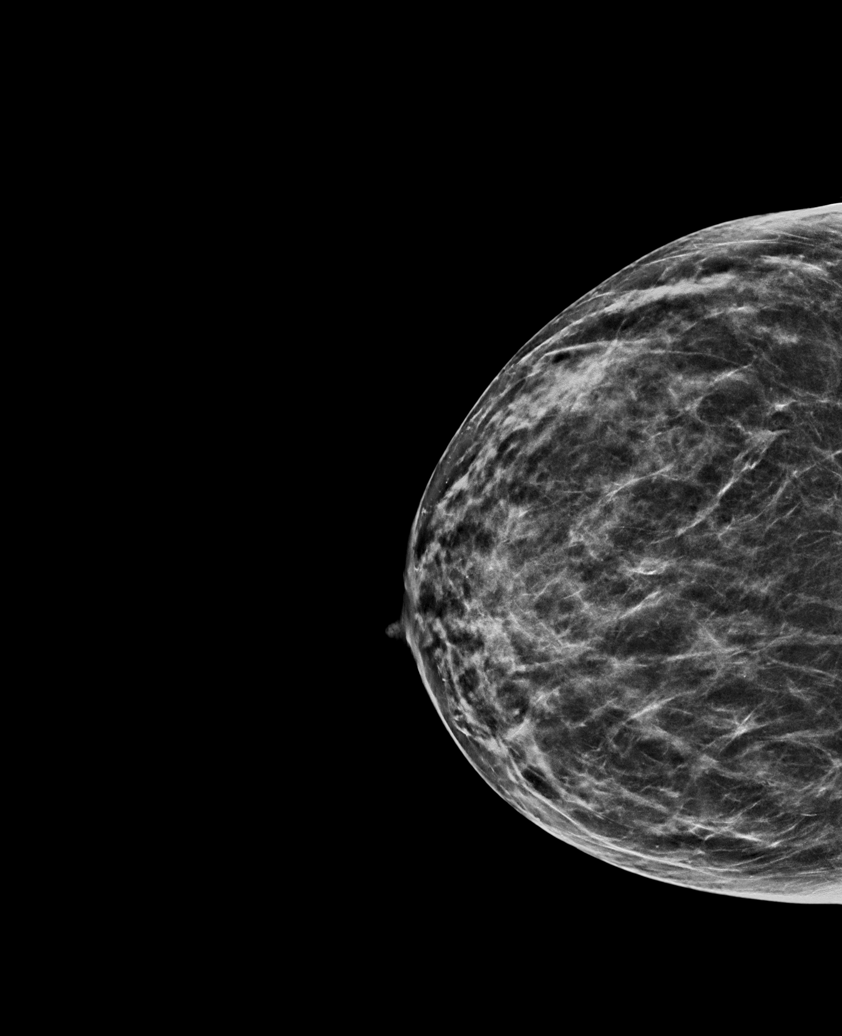

[L CC synth-2D (2 of 2)]
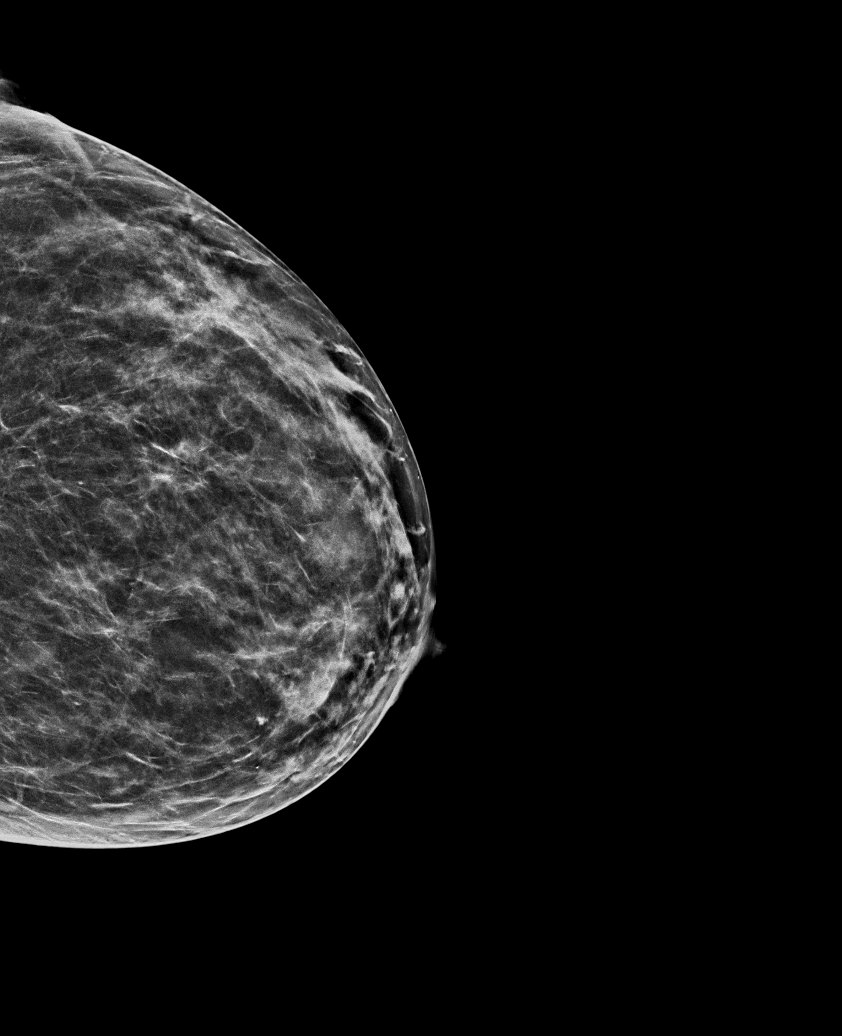

[R MLO tomo · tomo slice 33/65.0]
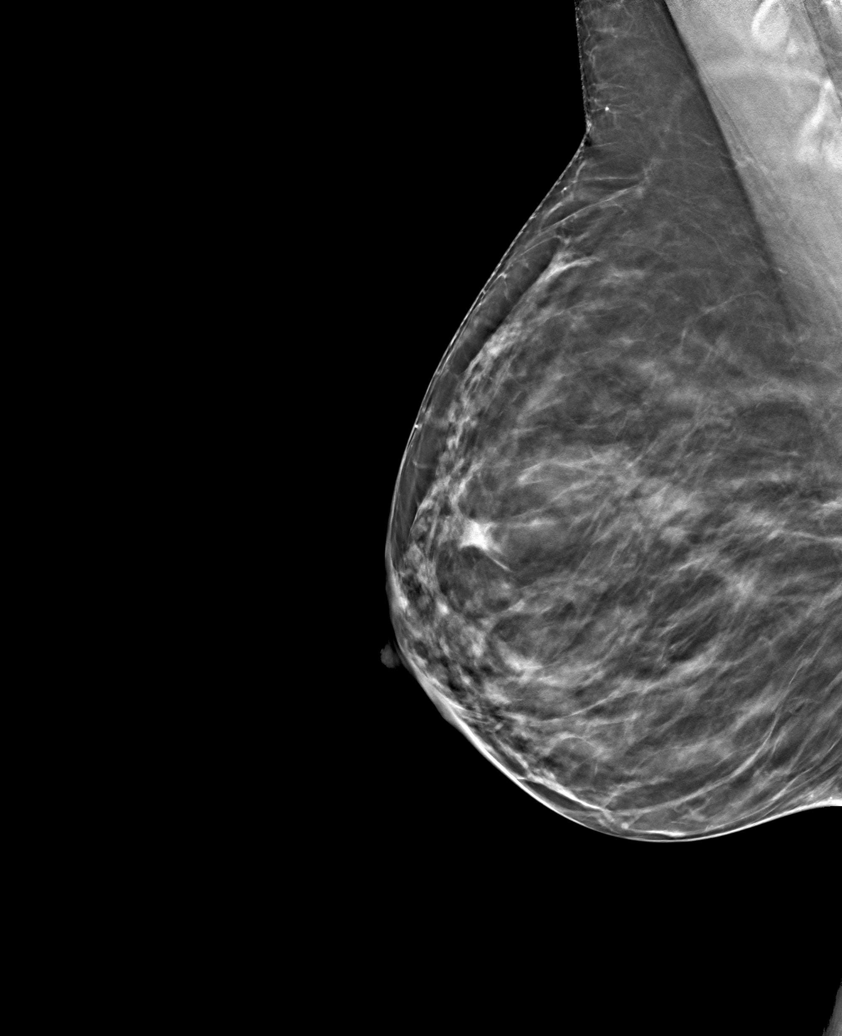

[6 of 30 positions shown; findings below may reference images not displayed]

ACR Breast Density Category c: The breast tissue is heterogeneously
dense, which may obscure small masses.
FINDINGS: There are no findings suspicious for malignancy. Images were
processed with CAD.
IMPRESSION: No mammographic evidence of malignancy. A result letter of this
screening mammogram will be mailed directly to the patient.

RECOMMENDATION:
Screening mammogram in one year. (Code:FT-U-LHB)

BI-RADS CATEGORY  1: Negative.

## 2021-09-14 ENCOUNTER — Other Ambulatory Visit: Payer: Self-pay | Admitting: Obstetrics and Gynecology

## 2021-09-28 ENCOUNTER — Encounter
Admission: RE | Admit: 2021-09-28 | Discharge: 2021-09-28 | Disposition: A | Discharge status: Home / Self Care | Payer: No Typology Code available for payment source | Source: Ambulatory Visit | Attending: Obstetrics and Gynecology | Admitting: Obstetrics and Gynecology

## 2021-09-28 VITALS — Ht 65.0 in | Wt 145.0 lb

## 2021-09-28 DIAGNOSIS — Z01812 Encounter for preprocedural laboratory examination: Secondary | ICD-10-CM

## 2021-09-28 NOTE — Patient Instructions (Addendum)
Your procedure is scheduled on: Thursday October 05, 2021. Report to Day [facility] inside [facility] 2nd floor, stop by admissions desk before getting on elevator.  To find out your arrival time please call [phone] between 1PM - 3PM on Wednesday October 04, 2021.  Remember: Instructions that are not followed completely may result in serious medical risk,  up to and including death, or upon the discretion of your surgeon and anesthesiologist your  surgery may need to be rescheduled.     _X__ 1. Do not eat food after midnight the night before your procedure.                 No chewing gum or hard candies. You may drink clear liquids up to 2 hours                 before you are scheduled to arrive for your surgery- DO not drink clear                 liquids within 2 hours of the start of your surgery.                 Clear Liquids include:  water.   __X__2.   Complete the "Ensure Clear Pre-surgery Clear Carbohydrate Drink" provided to you, 2 hours before arrival. **If you are diabetic you will be provided with an alternative drink, Gatorade Zero or G2.  __X__3.  On the morning of surgery brush your teeth with toothpaste and water, you                may rinse your mouth with mouthwash if you wish.  Do not swallow any toothpaste or mouthwash.     _X__ 4.  No Alcohol for 24 hours before or after surgery.   _X__ 5.  Do Not Smoke or use e-cigarettes For 24 Hours Prior to Your Surgery.                 Do not use any chewable tobacco products for at least 6 hours prior to                 Surgery.  _X__  6.  Do not use any recreational drugs (marijuana, cocaine, heroin, ecstasy, MDMA or other)                For at least one week prior to your surgery.  Combination of these drugs with anesthesia                May have life threatening results.  ____  7.  Bring all medications with you on the day of surgery if instructed.   _X__ 8.  Notify your doctor if there is any change  in your medical condition      (cold, fever, infections).     Do not wear jewelry, make-up, hairpins, clips or nail polish. Do not wear lotions, powders, or perfumes. You may wear deodorant. Do not shave 48 hours prior to surgery. Men may shave face and neck. Do not bring valuables to the hospital.    [facility] is not responsible for any belongings or valuables.  Contacts, dentures or bridgework may not be worn into surgery. Leave your suitcase in the car. After surgery it may be brought to your room. For patients admitted to the hospital, discharge time is determined by your treatment team.   Patients discharged the day of surgery will not be allowed to drive home.  Make arrangements for someone to be with you for the first 24 hours of your Same Day Discharge.   __X__ Take these medicines the morning of surgery with A SIP OF WATER:    1. DULoxetine (CYMBALTA) 60 MG  2. lamoTRIgine (LAMICTAL) 300 MG   3. busPIRone (BUSPAR) 5 MG  4.  5.  6.  ____ Fleet Enema (as directed)   __X__ Use CHG Soap (or wipes) as directed  ____ Use Benzoyl Peroxide Gel as instructed  __X__ Use inhalers on the day of surgery  albuterol (PROVENTIL HFA;VENTOLIN HFA) 108 (90 Base) MCG/ACT inhaler  ____ Stop metformin 2 days prior to surgery    ____ Take 1/2 of usual insulin dose the night before surgery. No insulin the morning          of surgery.   ____ Call your PCP, cardiologist, or Pulmonologist if taking Coumadin/Plavix/aspirin and ask when to stop before your surgery.   __X__ One Week prior to surgery- Stop Anti-inflammatories such as Ibuprofen, Aleve, Advil, Motrin, meloxicam (MOBIC), diclofenac, etodolac, ketorolac, Toradol, Daypro, piroxicam, Goody's or BC powders. OK TO USE TYLENOL IF NEEDED   __X__ One week prior to surgery- Stop supplements until after surgery.    ____ Bring C-Pap to the hospital.    If you have any questions regarding your pre-procedure instructions,  Please  call Pre-admit Testing at [phone]

## 2021-09-29 ENCOUNTER — Encounter
Admission: RE | Admit: 2021-09-29 | Discharge: 2021-09-29 | Disposition: A | Discharge status: Home / Self Care | Payer: No Typology Code available for payment source | Source: Ambulatory Visit | Attending: Obstetrics and Gynecology | Admitting: Obstetrics and Gynecology

## 2021-09-29 DIAGNOSIS — Z01812 Encounter for preprocedural laboratory examination: Secondary | ICD-10-CM | POA: Insufficient documentation

## 2021-09-29 DIAGNOSIS — N924 Excessive bleeding in the premenopausal period: Secondary | ICD-10-CM | POA: Diagnosis not present

## 2021-09-29 LAB — TYPE AND SCREEN
ABO/RH(D): O POS
Antibody Screen: NEGATIVE

## 2021-10-04 MED ORDER — LACTATED RINGERS IV SOLN: INTRAVENOUS | Status: DC

## 2021-10-04 MED ORDER — CHLORHEXIDINE GLUCONATE 0.12 % MT SOLN: 15.0000 mL | Freq: Once | OROMUCOSAL | Status: AC

## 2021-10-04 MED ORDER — FAMOTIDINE 20 MG PO TABS: 20.0000 mg | ORAL_TABLET | Freq: Once | ORAL | Status: AC

## 2021-10-04 MED ORDER — ORAL CARE MOUTH RINSE: 15.0000 mL | Freq: Once | OROMUCOSAL | Status: AC

## 2021-10-05 ENCOUNTER — Ambulatory Visit
Admission: RE | Admit: 2021-10-05 | Discharge: 2021-10-05 | Disposition: A | Discharge status: Home / Self Care | Payer: No Typology Code available for payment source | Source: Ambulatory Visit | Attending: Obstetrics and Gynecology | Admitting: Obstetrics and Gynecology

## 2021-10-05 ENCOUNTER — Other Ambulatory Visit: Payer: Self-pay

## 2021-10-05 ENCOUNTER — Ambulatory Visit: Payer: No Typology Code available for payment source | Admitting: Anesthesiology

## 2021-10-05 ENCOUNTER — Encounter: Payer: Self-pay | Admitting: Obstetrics and Gynecology

## 2021-10-05 ENCOUNTER — Encounter
Admission: RE | Disposition: A | Discharge status: Home / Self Care | Payer: Self-pay | Source: Ambulatory Visit | Attending: Obstetrics and Gynecology

## 2021-10-05 DIAGNOSIS — N924 Excessive bleeding in the premenopausal period: Secondary | ICD-10-CM | POA: Diagnosis not present

## 2021-10-05 DIAGNOSIS — M1990 Unspecified osteoarthritis, unspecified site: Secondary | ICD-10-CM | POA: Insufficient documentation

## 2021-10-05 DIAGNOSIS — J45909 Unspecified asthma, uncomplicated: Secondary | ICD-10-CM | POA: Insufficient documentation

## 2021-10-05 DIAGNOSIS — F32A Depression, unspecified: Secondary | ICD-10-CM | POA: Insufficient documentation

## 2021-10-05 DIAGNOSIS — F419 Anxiety disorder, unspecified: Secondary | ICD-10-CM | POA: Insufficient documentation

## 2021-10-05 DIAGNOSIS — Z01812 Encounter for preprocedural laboratory examination: Secondary | ICD-10-CM

## 2021-10-05 HISTORY — PX: DILATATION AND CURETTAGE/HYSTEROSCOPY WITH MINERVA: SHX6851

## 2021-10-05 LAB — POCT PREGNANCY, URINE: Preg Test, Ur: NEGATIVE

## 2021-10-05 LAB — ABO/RH: ABO/RH(D): O POS

## 2021-10-05 SURGERY — DILATATION AND CURETTAGE/HYSTEROSCOPY WITH MINERVA
Anesthesia: General

## 2021-10-05 MED ORDER — IBUPROFEN 600 MG PO TABS: 600.0000 mg | ORAL_TABLET | Freq: Four times a day (QID) | ORAL | 0 refills | Status: AC

## 2021-10-05 MED ORDER — ONDANSETRON HCL 4 MG/2ML IJ SOLN: 4.0000 mg | Freq: Once | INTRAMUSCULAR | Status: DC | PRN

## 2021-10-05 MED ORDER — CHLORHEXIDINE GLUCONATE 0.12 % MT SOLN
OROMUCOSAL | Status: AC
  Administered 2021-10-05: 15 mL via OROMUCOSAL
  Filled 2021-10-05: qty 15

## 2021-10-05 MED ORDER — MIDAZOLAM HCL 2 MG/2ML IJ SOLN
INTRAMUSCULAR | Status: DC | PRN
  Administered 2021-10-05: 2 mg via INTRAVENOUS

## 2021-10-05 MED ORDER — ONDANSETRON HCL 4 MG/2ML IJ SOLN
INTRAMUSCULAR | Status: AC
  Filled 2021-10-05: qty 2

## 2021-10-05 MED ORDER — ONDANSETRON HCL 4 MG/2ML IJ SOLN
INTRAMUSCULAR | Status: DC | PRN
  Administered 2021-10-05: 4 mg via INTRAVENOUS

## 2021-10-05 MED ORDER — FENTANYL CITRATE (PF) 100 MCG/2ML IJ SOLN
25.0000 ug | INTRAMUSCULAR | Status: DC | PRN
  Administered 2021-10-05: 25 ug via INTRAVENOUS

## 2021-10-05 MED ORDER — LIDOCAINE HCL (PF) 2 % IJ SOLN
INTRAMUSCULAR | Status: AC
  Filled 2021-10-05: qty 5

## 2021-10-05 MED ORDER — FENTANYL CITRATE (PF) 100 MCG/2ML IJ SOLN
INTRAMUSCULAR | Status: AC
  Filled 2021-10-05: qty 2

## 2021-10-05 MED ORDER — MIDAZOLAM HCL 2 MG/2ML IJ SOLN
INTRAMUSCULAR | Status: AC
Start: 2021-10-05 — End: ?
  Filled 2021-10-05: qty 2

## 2021-10-05 MED ORDER — DEXMEDETOMIDINE HCL IN NACL 200 MCG/50ML IV SOLN
INTRAVENOUS | Status: DC | PRN
  Administered 2021-10-05: 8 ug via INTRAVENOUS

## 2021-10-05 MED ORDER — DEXAMETHASONE SODIUM PHOSPHATE 10 MG/ML IJ SOLN
INTRAMUSCULAR | Status: DC | PRN
  Administered 2021-10-05: 10 mg via INTRAVENOUS

## 2021-10-05 MED ORDER — PROPOFOL 10 MG/ML IV BOLUS
INTRAVENOUS | Status: AC
  Filled 2021-10-05: qty 20

## 2021-10-05 MED ORDER — IBUPROFEN 600 MG PO TABS
ORAL_TABLET | ORAL | Status: AC
  Administered 2021-10-05: 600 mg via ORAL
  Filled 2021-10-05: qty 1

## 2021-10-05 MED ORDER — SILVER NITRATE-POT NITRATE 75-25 % EX MISC
CUTANEOUS | Status: DC | PRN
  Administered 2021-10-05: 2 via TOPICAL

## 2021-10-05 MED ORDER — FENTANYL CITRATE (PF) 100 MCG/2ML IJ SOLN
INTRAMUSCULAR | Status: DC | PRN
  Administered 2021-10-05 (×2): 50 ug via INTRAVENOUS

## 2021-10-05 MED ORDER — FAMOTIDINE 20 MG PO TABS
ORAL_TABLET | ORAL | Status: AC
  Administered 2021-10-05: 20 mg via ORAL
  Filled 2021-10-05: qty 1

## 2021-10-05 MED ORDER — LIDOCAINE HCL (CARDIAC) PF 100 MG/5ML IV SOSY
PREFILLED_SYRINGE | INTRAVENOUS | Status: DC | PRN
  Administered 2021-10-05: 50 mg via INTRAVENOUS

## 2021-10-05 MED ORDER — PROPOFOL 10 MG/ML IV BOLUS
INTRAVENOUS | Status: DC | PRN
  Administered 2021-10-05: 50 mg via INTRAVENOUS
  Administered 2021-10-05: 150 mg via INTRAVENOUS

## 2021-10-05 MED ORDER — IBUPROFEN 600 MG PO TABS
600.0000 mg | ORAL_TABLET | Freq: Once | ORAL | Status: AC
Start: 2021-10-05 — End: 2021-10-05

## 2021-10-05 MED ORDER — DEXAMETHASONE SODIUM PHOSPHATE 10 MG/ML IJ SOLN
INTRAMUSCULAR | Status: AC
  Filled 2021-10-05: qty 1

## 2021-10-05 SURGICAL SUPPLY — 30 items
BACTOSHIELD CHG 4% 4OZ (MISCELLANEOUS) ×1
BAG DRN RND TRDRP ANRFLXCHMBR (UROLOGICAL SUPPLIES)
BAG URINE DRAIN 2000ML AR STRL (UROLOGICAL SUPPLIES) IMPLANT
CATH FOLEY 2WAY  5CC 16FR (CATHETERS)
CATH FOLEY 2WAY 5CC 16FR (CATHETERS)
CATH URTH 16FR FL 2W BLN LF (CATHETERS) IMPLANT
DEVICE MYOSURE LITE (MISCELLANEOUS) ×1 IMPLANT
DEVICE MYOSURE REACH (MISCELLANEOUS) ×1 IMPLANT
DRSG TELFA 3X8 NADH (GAUZE/BANDAGES/DRESSINGS) IMPLANT
ELECT REM PT RETURN 9FT ADLT (ELECTROSURGICAL) ×2
ELECTRODE REM PT RTRN 9FT ADLT (ELECTROSURGICAL) ×1 IMPLANT
GLOVE BIO SURGEON STRL SZ7 (GLOVE) ×2 IMPLANT
GLOVE SURG UNDER POLY LF SZ7.5 (GLOVE) ×2 IMPLANT
GOWN STRL REUS W/ TWL LRG LVL3 (GOWN DISPOSABLE) ×2 IMPLANT
GOWN STRL REUS W/TWL LRG LVL3 (GOWN DISPOSABLE) ×4
HANDPIECE ABLA MINERVA ENDO (MISCELLANEOUS) ×1 IMPLANT
IV NS IRRIG 3000ML ARTHROMATIC (IV SOLUTION) ×2 IMPLANT
KIT PROCEDURE FLUENT (KITS) ×2 IMPLANT
KIT TURNOVER CYSTO (KITS) ×2 IMPLANT
MANIFOLD NEPTUNE II (INSTRUMENTS) ×2 IMPLANT
PACK DNC HYST (MISCELLANEOUS) ×2 IMPLANT
PAD DRESSING TELFA 3X8 NADH (GAUZE/BANDAGES/DRESSINGS) IMPLANT
PAD OB MATERNITY 4.3X12.25 (PERSONAL CARE ITEMS) ×2 IMPLANT
PAD PREP 24X41 OB/GYN DISP (PERSONAL CARE ITEMS) ×2 IMPLANT
SCRUB CHG 4% DYNA-HEX 4OZ (MISCELLANEOUS) ×1 IMPLANT
SEAL ROD LENS SCOPE MYOSURE (ABLATOR) ×2 IMPLANT
SET CYSTO W/LG BORE CLAMP LF (SET/KITS/TRAYS/PACK) IMPLANT
SURGILUBE 2OZ TUBE FLIPTOP (MISCELLANEOUS) ×2 IMPLANT
TUBING CONNECTING 10 (TUBING) ×2 IMPLANT
WATER STERILE IRR 500ML POUR (IV SOLUTION) ×2 IMPLANT

## 2021-10-05 NOTE — Anesthesia Procedure Notes (Signed)
Procedure Name: LMA Insertion Date/Time: 10/05/2021 12:29 PM  Performed by: Omer Jack, CRNAPre-anesthesia Checklist: Patient identified, Patient being monitored, Timeout performed, Emergency Drugs available and Suction available Patient Re-evaluated:Patient Re-evaluated prior to induction Oxygen Delivery Method: Circle system utilized Preoxygenation: Pre-oxygenation with 100% oxygen Induction Type: IV induction Ventilation: Mask ventilation without difficulty LMA: LMA inserted LMA Size: 4.0 Tube type: Oral Number of attempts: 1 Placement Confirmation: positive ETCO2 and breath sounds checked- equal and bilateral Tube secured with: Tape Dental Injury: Teeth and Oropharynx as per pre-operative assessment

## 2021-10-05 NOTE — Interval H&P Note (Signed)
History and Physical Interval Note:  10/05/2021 11:51 AM  Sara Velasquez  has presented today for surgery, with the diagnosis of Menorrhagia with irregular cycle.  The various methods of treatment have been discussed with the patient and family. After consideration of risks, benefits and other options for treatment, the patient has consented to  Procedure(s): DILATATION AND CURETTAGE/HYSTEROSCOPY WITH endometrial ablation as a surgical intervention.  The patient's history has been reviewed, patient examined, no change in status, stable for surgery.  I have reviewed the patient's chart and labs.  Questions were answered to the patient's satisfaction.  Consents reviewed. Patient wishes to proceed.  Thomasene Mohair, MD, [facility] 10/05/2021 11:51 AM

## 2021-10-05 NOTE — Anesthesia Preprocedure Evaluation (Signed)
Anesthesia Evaluation  Patient identified by MRN, date of birth, ID band Patient awake    Reviewed: Allergy & Precautions, NPO status , Patient's Chart, lab work & pertinent test results  Airway Mallampati: II  TM Distance: >3 FB Neck ROM: Full    Dental  (+) Teeth Intact   Pulmonary neg pulmonary ROS, asthma ,    Pulmonary exam normal breath sounds clear to auscultation       Cardiovascular Exercise Tolerance: Good negative cardio ROS Normal cardiovascular exam Rhythm:Regular Rate:Normal     Neuro/Psych Anxiety negative neurological ROS  negative psych ROS   GI/Hepatic negative GI ROS, Neg liver ROS,   Endo/Other  negative endocrine ROS  Renal/GU negative Renal ROS     Musculoskeletal   Abdominal Normal abdominal exam  (+)   Peds negative pediatric ROS (+)  Hematology negative hematology ROS (+)   Anesthesia Other Findings Past Medical History: No date: Anxiety No date: Arthritis     Comment:  right knee No date: Asthma     Comment:  exercise induced No date: Depression No date: Leaky heart valve     Comment:  followed by PCP  Past Surgical History: 01/29/2018: HALLUX VALGUS AUSTIN; Right     Comment:  Procedure: HALLUX VALGUS AUSTIN;  Surgeon: Gwyneth Revels, DPM;  Location: [facility];  Service:               Podiatry;  Laterality: Right;  GENERAL WITH LOCAL 06/29/2015: KNEE ARTHROSCOPY; Left     Comment:  Procedure: Arthroscopic excision of medial plica with               abrasion chondroplasty of patella, left knee.;  Surgeon:               Christena Flake, MD;  Location: [facility];                Service: Orthopedics;  Laterality: Left; No date: TUBAL LIGATION  BMI    Body Mass Index: 23.80 kg/m      Reproductive/Obstetrics negative OB ROS                             Anesthesia Physical Anesthesia Plan  ASA: 2  Anesthesia Plan:  General   Post-op Pain Management:    Induction: Intravenous  PONV Risk Score and Plan: 1 and Ondansetron and Dexamethasone  Airway Management Planned: LMA  Additional Equipment:   Intra-op Plan:   Post-operative Plan: Extubation in OR  Informed Consent: I have reviewed the patients History and Physical, chart, labs and discussed the procedure including the risks, benefits and alternatives for the proposed anesthesia with the patient or authorized representative who has indicated his/her understanding and acceptance.     Dental Advisory Given  Plan Discussed with: CRNA and Surgeon  Anesthesia Plan Comments:         Anesthesia Quick Evaluation

## 2021-10-05 NOTE — Discharge Instructions (Signed)
AMBULATORY SURGERY  ?DISCHARGE INSTRUCTIONS ? ? ?The drugs that you were given will stay in your system until tomorrow so for the next 24 hours you should not: ? ?Drive an automobile ?Make any legal decisions ?Drink any alcoholic beverage ? ? ?You may resume regular meals tomorrow.  Today it is better to start with liquids and gradually work up to solid foods. ? ?You may eat anything you prefer, but it is better to start with liquids, then soup and crackers, and gradually work up to solid foods. ? ? ?Please notify your doctor immediately if you have any unusual bleeding, trouble breathing, redness and pain at the surgery site, drainage, fever, or pain not relieved by medication. ? ? ? ?Additional Instructions: ? ? ? ?Please contact your physician with any problems or Same Day Surgery at [phone], Monday through Friday 6 am to 4 pm, or [place] [facility] [place] Main number at [phone].  ?

## 2021-10-05 NOTE — Op Note (Signed)
Operative Note    Name: Sara Velasquez  Date of Birth: 1970/04/04  Date of Service: 10/05/2021  MRN: [id number]   PRE-OP DIAGNOSIS: Abnormal perimenopausal bleeding [N92.4]  POST-OP DIAGNOSIS: Abnormal perimenopausal bleeding [N92.4]  SURGEON: Conard Novak, MD  PROCEDURE:  1) Hysteroscopy, dilation and curettage 2) Endometrial ablation  ANESTHESIA: General   ESTIMATED BLOOD LOSS: 5 mL   IV Fluid: 400 mL crystalloid  SPECIMENS: endometrial curettings  FLUID DEFICIT: min   COMPLICATIONS: none   DISPOSITION: PACU - hemodynamically stable.   CONDITION: stable   FINDINGS: Exam under anesthesia revealed small, mobile uterus with no masses and bilateral adnexa without masses or fullness. Hysteroscopy revealed grossly normal appearing uterine cavity with bilateral tubal ostia and normal appearing endocervical canal. Findings after ablation revealed globally ablated endometrium.   PROCEDURE IN DETAIL: After informed consent was obtained, the patient was taken to the operating room where anesthesia was obtained without difficulty. The patient was positioned in the dorsal, supine lithotomy position in [place] stirrups. The patient's bladder was catheterized with an in and out foley catheter. The patient was examined under anesthesia, with the above noted findings. The bivalved speculum was placed inside the patient's vagina, and the the anterior lip of the cervix was grasped with the tenaculum. The uterine cavity was sounded to 8 cm, and then the cervix was progressively dilated to 8 mm using Hegar dilators. The hysteroscope was introduced, with LR fluid used to distend the intrauterine cavity, with the above noted findings were appreciated.    The hystersocope was removed and the uterine cavity was curetted until a gritty texture was noted, yielding adequate endometrial curettings. The cervix was measured to be 4 cm, leaving a cavity depth of 4 cm.    The Minerva device was  then placed without difficulty. The balloon was insufflated passing initial seal test and the device was deployed with the cavity, width demonstrating sufficient size as demonstrated by the black line appearing within the clear window. The seal test was successful.  After confirmation, the procedure performed. Length of procedure was the standard 120 seconds. After successful ablation the balloon was deflated and the device was then removed and repeat hysteroscopy revealed an appropriate ablation of the lining of the uterus and no perforation or injury. Hysteroscope was removed with minimal discrepancy of fluid.   Tenaculum was removed with excellent hemostasis noted after application of silver nitrate to the tenaculum entry sites. After verifying no sponges or instruments left in the vagina, the speculum was removed.  She was then taken out of dorsal lithotomy. Hemostasis noted.   The patient tolerated the procedure well. Sponge, lap and needle counts were correct x2. The patient was taken to recovery room in excellent condition.  Thomasene Mohair, MD, [facility] 10/05/2021 1:19 PM

## 2021-10-05 NOTE — Transfer of Care (Signed)
Immediate Anesthesia Transfer of Care Note  Patient: Sara Velasquez  Procedure(s) Performed: DILATATION AND CURETTAGE/HYSTEROSCOPY WITH MINERVA  Patient Location: PACU  Anesthesia Type:General  Level of Consciousness: drowsy and patient cooperative  Airway & Oxygen Therapy: Patient Spontanous Breathing and Patient connected to nasal cannula oxygen  Post-op Assessment: Report given to RN and Post -op Vital signs reviewed and stable  Post vital signs: Reviewed and stable  Last Vitals:  Vitals Value Taken Time  BP 138/72 10/05/21 1320  Temp 36.2 C 10/05/21 1320  Pulse 101 10/05/21 1325  Resp 23 10/05/21 1325  SpO2 100 % 10/05/21 1325  Vitals shown include unvalidated device data.  Last Pain:  Vitals:   10/05/21 1320  TempSrc:   PainSc: Asleep         Complications: No notable events documented.

## 2021-10-06 ENCOUNTER — Encounter: Payer: Self-pay | Admitting: Obstetrics and Gynecology

## 2021-10-06 LAB — SURGICAL PATHOLOGY

## 2021-10-06 NOTE — Anesthesia Postprocedure Evaluation (Signed)
Anesthesia Post Note  Patient: Uliana Brinker  Procedure(s) Performed: DILATATION AND CURETTAGE/HYSTEROSCOPY WITH MINERVA  Patient location during evaluation: PACU Anesthesia Type: General Level of consciousness: awake and awake and alert Pain management: pain level controlled Vital Signs Assessment: post-procedure vital signs reviewed and stable Respiratory status: spontaneous breathing and nonlabored ventilation Cardiovascular status: stable Anesthetic complications: no   No notable events documented.   Last Vitals:  Vitals:   10/05/21 1404 10/05/21 1438  BP: (!) 168/81 (!) 169/92  Pulse: 76 75  Resp: 16 16  Temp: 36.6 C   SpO2: 97% 100%    Last Pain:  Vitals:   10/05/21 1438  TempSrc:   PainSc: 2                  VAN STAVEREN,Misha Vanoverbeke

## 2022-05-31 ENCOUNTER — Other Ambulatory Visit: Payer: Self-pay | Admitting: Obstetrics and Gynecology

## 2022-05-31 DIAGNOSIS — Z1231 Encounter for screening mammogram for malignant neoplasm of breast: Secondary | ICD-10-CM
